# Patient Record
Sex: Male | Born: 1960 | Race: Black or African American | Hispanic: No | Smoking: Never smoker
Health system: Southern US, Community
[De-identification: ages and names within clinical notes are randomized; demographics above are authoritative.]

## PROBLEM LIST (undated history)

## (undated) DIAGNOSIS — I10 Essential (primary) hypertension: Secondary | ICD-10-CM

## (undated) DIAGNOSIS — E7800 Pure hypercholesterolemia, unspecified: Secondary | ICD-10-CM

## (undated) HISTORY — PX: KNEE SURGERY: SHX244

---

## 2019-09-18 ENCOUNTER — Other Ambulatory Visit: Payer: Self-pay

## 2019-09-18 ENCOUNTER — Emergency Department (HOSPITAL_COMMUNITY): Payer: Self-pay

## 2019-09-18 ENCOUNTER — Encounter (HOSPITAL_COMMUNITY): Payer: Self-pay | Admitting: *Deleted

## 2019-09-18 ENCOUNTER — Emergency Department (HOSPITAL_COMMUNITY)
Admission: EM | Admit: 2019-09-18 | Discharge: 2019-09-19 | Disposition: A | Discharge status: Home / Self Care | Payer: Self-pay | Attending: Emergency Medicine | Admitting: Emergency Medicine

## 2019-09-18 DIAGNOSIS — I10 Essential (primary) hypertension: Secondary | ICD-10-CM | POA: Insufficient documentation

## 2019-09-18 DIAGNOSIS — R079 Chest pain, unspecified: Secondary | ICD-10-CM

## 2019-09-18 DIAGNOSIS — R072 Precordial pain: Secondary | ICD-10-CM | POA: Insufficient documentation

## 2019-09-18 LAB — CBC
HCT: 40.1 % (ref 39.0–52.0)
Hemoglobin: 13.4 g/dL (ref 13.0–17.0)
MCH: 31.6 pg (ref 26.0–34.0)
MCHC: 33.4 g/dL (ref 30.0–36.0)
MCV: 94.6 fL (ref 80.0–100.0)
Platelets: 321 10*3/uL (ref 150–400)
RBC: 4.24 MIL/uL (ref 4.22–5.81)
RDW: 11.9 % (ref 11.5–15.5)
WBC: 7.9 10*3/uL (ref 4.0–10.5)
nRBC: 0 % (ref 0.0–0.2)

## 2019-09-18 LAB — BASIC METABOLIC PANEL
Anion gap: 10 (ref 5–15)
BUN: 12 mg/dL (ref 6–20)
CO2: 23 mmol/L (ref 22–32)
Calcium: 9.5 mg/dL (ref 8.9–10.3)
Chloride: 105 mmol/L (ref 98–111)
Creatinine, Ser: 1.05 mg/dL (ref 0.61–1.24)
GFR calc Af Amer: >60 mL/min (ref >60)
GFR calc non Af Amer: >60 mL/min (ref >60)
Glucose, Bld: 101 mg/dL — ABNORMAL HIGH (ref 70–99)
Potassium: 3.1 mmol/L — ABNORMAL LOW (ref 3.5–5.1)
Sodium: 138 mmol/L (ref 135–145)

## 2019-09-18 LAB — TROPONIN I (HIGH SENSITIVITY)
Troponin I (High Sensitivity): 14 ng/L (ref <18)
Troponin I (High Sensitivity): 12 ng/L (ref <18)

## 2019-09-18 MED ORDER — SODIUM CHLORIDE 0.9% FLUSH: 3.0000 mL | Freq: Once | INTRAVENOUS | Status: DC

## 2019-09-18 NOTE — ED Triage Notes (Signed)
Pt reporting right sided chest throbbing just to the right of the sternum, onset while walking, denies any further symptoms.

## 2019-09-18 NOTE — ED Triage Notes (Signed)
Pt arrives via [facility] from the side of the road. He was c/o right sided chest tightness that started about an hour ago after getting off work. Once told he had a "leaky valve". EKG showing RBBB, 324 ASA, refused IV and nitro en route. 70's hr, 150/98, 100% RA, 97.9oral.

## 2019-09-19 ENCOUNTER — Emergency Department (HOSPITAL_COMMUNITY)
Admission: EM | Admit: 2019-09-19 | Discharge: 2019-09-20 | Disposition: A | Discharge status: Home / Self Care | Payer: HRSA Program | Attending: Emergency Medicine | Admitting: Emergency Medicine

## 2019-09-19 ENCOUNTER — Encounter (HOSPITAL_COMMUNITY): Payer: Self-pay | Admitting: Emergency Medicine

## 2019-09-19 ENCOUNTER — Other Ambulatory Visit: Payer: Self-pay

## 2019-09-19 DIAGNOSIS — F1210 Cannabis abuse, uncomplicated: Secondary | ICD-10-CM | POA: Insufficient documentation

## 2019-09-19 DIAGNOSIS — R519 Headache, unspecified: Secondary | ICD-10-CM | POA: Diagnosis not present

## 2019-09-19 DIAGNOSIS — Z20822 Contact with and (suspected) exposure to covid-19: Secondary | ICD-10-CM | POA: Insufficient documentation

## 2019-09-19 DIAGNOSIS — I10 Essential (primary) hypertension: Secondary | ICD-10-CM | POA: Diagnosis not present

## 2019-09-19 DIAGNOSIS — R0789 Other chest pain: Secondary | ICD-10-CM | POA: Diagnosis not present

## 2019-09-19 HISTORY — DX: Pure hypercholesterolemia, unspecified: E78.00

## 2019-09-19 HISTORY — DX: Essential (primary) hypertension: I10

## 2019-09-19 MED ORDER — ACETAMINOPHEN 325 MG PO TABS
650.0000 mg | ORAL_TABLET | Freq: Once | ORAL | Status: AC
  Administered 2019-09-19: 650 mg via ORAL
  Filled 2019-09-19: qty 2

## 2019-09-19 NOTE — ED Provider Notes (Signed)
[facility] EMERGENCY DEPARTMENT Provider Note   CSN: [id number] Arrival date & time: 09/18/19  1938     History Chief Complaint  Patient presents with  . Chest Pain    Jazmine Longshore is a 59 y.o. male.  HPI  HPI: A 59 year old patient with a history of hypertension and hypercholesterolemia presents for evaluation of chest pain. Initial onset of pain was approximately 3-6 hours ago. The patient's chest pain is described as heaviness/pressure/tightness and is not worse with exertion. The patient's chest pain is middle- or left-sided, is not well-localized, is not sharp and does not radiate to the arms/jaw/neck. The patient does not complain of nausea and denies diaphoresis. The patient has smoked in the past 90 days and has a family history of coronary artery disease in a first-degree relative with onset less than age 24. The patient has no history of stroke, has no history of peripheral artery disease, denies any history of treated diabetes and does not have an elevated BMI (>=30).  Patient presents for chest pain.  He reports he was riding his scooter home from work when he started having right-sided chest pain.  It feels like tightness.  It does not radiate anywhere. He has never had this before.  He does report mild shortness of breath. He reports he recently moved here and has no local physician.  He has been told that he has hypertension hyperlipidemia but he does not take medicines.  He was given aspirin in route by EMS, but refused nitroglycerin.  His pain is essentially resolved.  He was told that he had a "leaky valve "previously but cannot elaborate further about this Denies known history of CAD/PE/CVA  PMH- HTN/HLP fam hx - CAD in mother Past Surgical History:  Procedure Laterality Date  . KNEE SURGERY         No family history on file.  Social History   Tobacco Use  . Smoking status: Never Smoker  Substance Use Topics  . Alcohol use: Yes  . Drug use:  Yes    Types: Marijuana    Home Medications Prior to Admission medications   Not on File    Allergies    Patient has no known allergies.  Review of Systems   Review of Systems  Constitutional: Negative for fever.  Respiratory: Positive for shortness of breath.   Cardiovascular: Positive for chest pain.  Gastrointestinal: Negative for vomiting.  All other systems reviewed and are negative.   Physical Exam Updated Vital Signs BP 114/75 (BP Location: Right Arm)   Pulse (!) 51   Temp 97.7 F (36.5 C) (Oral)   Resp 20   Ht 1.791 m (5' 10.5")   Wt 81.6 kg   SpO2 97%   BMI 25.46 kg/m   Physical Exam CONSTITUTIONAL: Well developed/well nourished HEAD: Normocephalic/atraumatic EYES: EOMI/PERRL ENMT: Mucous membranes moist NECK: supple no meningeal signs SPINE/BACK:entire spine nontender CV: S1/S2 noted, no murmurs/rubs/gallops noted LUNGS: Lungs are clear to auscultation bilaterally, no apparent distress Chest - mild right sided tenderness ABDOMEN: soft, nontender, no RUQ tenderness NEURO: Pt is awake/alert/appropriate, moves all extremitiesx4.  No facial droop.   EXTREMITIES: pulses normal/equal, full ROM, no LE edema/tenderness SKIN: warm, color normal PSYCH: no abnormalities of mood noted, alert and oriented to situation  ED Results / Procedures / Treatments   Labs (all labs ordered are listed, but only abnormal results are displayed) Labs Reviewed  BASIC METABOLIC PANEL - Abnormal; Notable for the following components:  Result Value   Potassium 3.1 (*)    Glucose, Bld 101 (*)    All other components within normal limits  CBC  TROPONIN I (HIGH SENSITIVITY)  TROPONIN I (HIGH SENSITIVITY)    EKG EKG Interpretation  Date/Time:  Friday September 18 2019 19:47:15 EST Ventricular Rate:  98 PR Interval:  158 QRS Duration: 130 QT Interval:  408 QTC Calculation: 520 R Axis:   -54 Text Interpretation: Normal sinus rhythm Right bundle branch block Left  anterior fascicular block ** Bifascicular block ** Abnormal ECG No previous ECGs available Confirmed by Kennis Carina [phone]) on 09/18/2019 8:06:30 PM   Radiology DG Chest 2 View  Result Date: 09/18/2019 CLINICAL DATA:  Chest pain EXAM: CHEST - 2 VIEW COMPARISON:  None. FINDINGS: The heart size and mediastinal contours are within normal limits. Both lungs are clear. No acute osseous abnormality. IMPRESSION: No active cardiopulmonary disease. Electronically Signed   By: Jonna Clark M.D.   On: 09/18/2019 21:14    Procedures Procedures  Medications Ordered in ED Medications  sodium chloride flush (NS) 0.9 % injection 3 mL (has no administration in time range)    ED Course  I have reviewed the triage vital signs and the nursing notes.  Pertinent labs & imaging results that were available during my care of the patient were reviewed by me and considered in my medical decision making (see chart for details).    MDM Rules/Calculators/A&P HEAR Score: 5                   Repeat ekg:  EKG Interpretation  Date/Time:  Saturday September 19 2019 05:21:04 EST Ventricular Rate:  47 PR Interval:  158 QRS Duration: 150 QT Interval:  511 QTC Calculation: 452 R Axis:   24 Text Interpretation: Sinus bradycardia Right bundle branch block No significant change since last tracing Confirmed by Zadie Rhine ([id number]) on 09/19/2019 5:29:17 AM       6:24 AM Patient presented with chest pain.  He reports this occurred when he was riding his scooter home for work.  He then called EMS and was given aspirin.  He has been pain-free for several hours, but did also have some reproducible chest pain. He reports a history of untreated hypertension/hyperlipidemia, but here his blood pressures have been normal Patient is not hypoxic, I have low suspicion for PE.  Low suspicion for dissection.  He has been resting comfortably in the ER watching television in no distress He does have an abnormal EKG with a right  bundle branch block, but is unclear if these are acute findings as we have no old to compare  Plan will be to discharge home. He will be referred to [facility] to establish with PCP We discussed strict return precautions Final Clinical Impression(s) / ED Diagnoses Final diagnoses:  Precordial pain  Nonspecific chest pain    Rx / DC Orders ED Discharge Orders    None       Zadie Rhine, MD 09/19/19 [phone]

## 2019-09-19 NOTE — Discharge Instructions (Addendum)
Thank you for allowing me to care for you today in the Emergency Department.   Your COVID test is pending.  You will receive a call from someone at the hospital with the results within 2 days only if your test is positive.  If you test is positive, you need to stay out of work for 14 days from when you had a positive COVID-19 test or when your symptoms began, whichever is earlier.   In the meantime, you should treat yourself as if you have the COVID-19 virus.  You should quarantine yourself at home.  If you have others in your home, try to isolate yourself in a separate room. Make sure that you are washing your hands frequently with warm water and soap. Additional information is included along with your discharge paperwork.  Take 650 mg of Tylenol or 600 mg of ibuprofen with food every 6 hours for pain.  You can alternate between these 2 medications every 3 hours if your pain returns.  For instance, you can take Tylenol at noon, followed by a dose of ibuprofen at 3, followed by second dose of Tylenol and 6.  If your COVID test is negative and you are continue to have symptoms, please follow-up with primary care.  If you do not have a primary care provider, please call the number on your discharge paperwork to get established with a primary care provider.  Return to the emergency department if you develop respiratory distress, if your finger lips turn blue, if you pass out, develop severe chest pain, if your fever stays elevated despite taking Tylenol as described above, or other new, concerning symptoms.

## 2019-09-19 NOTE — Discharge Instructions (Signed)

## 2019-09-19 NOTE — ED Notes (Signed)
Called [facility] for transport to [facility]

## 2019-09-19 NOTE — ED Triage Notes (Signed)
Patient requesting Covid test , denies any symptoms , afebrile/respirations unlabored.

## 2019-09-19 NOTE — ED Provider Notes (Signed)
[facility] EMERGENCY DEPARTMENT Provider Note   CSN: [id number] Arrival date & time: 09/19/19  2144     History Chief Complaint  Patient presents with  . Requesting Covid test    Derrick Santana is a 59 y.o. male with a history of hypertension and hypercholesteremia who presents to the emergency department with a chief complaint of requesting a COVID-19 test.  The patient was seen in the ER yesterday for chest pain that developed while he was riding his scooter home from work.  He reports that chest pain has since resolved.  He reports that he is feeling "hot" and has a headache, but has no other complaints at this time including chills, body aches, loss of sense of taste or smell, chest pain, shortness of breath, cough, nausea, vomiting, diarrhea, abdominal pain, dizziness, lightheadedness.  He states that after he was evaluated in the ER last night that "it dawned on him" that he might have COVID-19 because his brother currently lives with him, and his brother tested positive for COVID-19 4 days ago.  The history is provided by the patient. No language interpreter was used.       Past Medical History:  Diagnosis Date  . Hypercholesteremia   . Hypertension     There are no problems to display for this patient.   Past Surgical History:  Procedure Laterality Date  . KNEE SURGERY         No family history on file.  Social History   Tobacco Use  . Smoking status: Never Smoker  Substance Use Topics  . Alcohol use: Yes  . Drug use: Yes    Types: Marijuana    Home Medications Prior to Admission medications   Not on File    Allergies    Patient has no known allergies.  Review of Systems   Review of Systems  Constitutional: Negative for appetite change, chills and fever.  HENT: Negative for congestion and sore throat.   Eyes: Negative for visual disturbance.  Respiratory: Negative for shortness of breath and wheezing.   Cardiovascular: Negative  for chest pain and palpitations.  Gastrointestinal: Negative for abdominal pain, diarrhea, nausea and vomiting.  Genitourinary: Negative for dysuria, frequency, penile pain, penile swelling and urgency.  Musculoskeletal: Negative for arthralgias, back pain, myalgias, neck pain and neck stiffness.  Skin: Negative for rash.  Allergic/Immunologic: Negative for immunocompromised state.  Neurological: Positive for headaches. Negative for dizziness, seizures, syncope, weakness and numbness.  Psychiatric/Behavioral: Negative for confusion.    Physical Exam Updated Vital Signs BP (!) 128/93 (BP Location: Right Arm)   Pulse 70   Temp (!) 97.5 F (36.4 C) (Oral)   Resp 18   SpO2 93%   Physical Exam Vitals and nursing note reviewed.  Constitutional:      General: He is not in acute distress.    Appearance: He is well-developed. He is not ill-appearing, toxic-appearing or diaphoretic.     Comments: Well-appearing.  No acute distress.  HENT:     Head: Normocephalic.  Eyes:     Conjunctiva/sclera: Conjunctivae normal.  Cardiovascular:     Rate and Rhythm: Normal rate and regular rhythm.     Pulses: Normal pulses.     Heart sounds: Normal heart sounds. No murmur. No friction rub. No gallop.   Pulmonary:     Effort: Pulmonary effort is normal. No respiratory distress.     Breath sounds: No stridor. No wheezing, rhonchi or rales.     Comments: Lungs are  clear to auscultation bilaterally.  No increased work of breathing.  Speaks in complete, fluent sentences without increased work of breathing. Chest:     Chest wall: No tenderness.  Abdominal:     General: There is no distension.     Palpations: Abdomen is soft. There is no mass.     Tenderness: There is no abdominal tenderness. There is no right CVA tenderness, left CVA tenderness, guarding or rebound.     Hernia: No hernia is present.  Musculoskeletal:     Cervical back: Neck supple.  Skin:    General: Skin is warm and dry.    Neurological:     Mental Status: He is alert.  Psychiatric:        Behavior: Behavior normal.     ED Results / Procedures / Treatments   Labs (all labs ordered are listed, but only abnormal results are displayed) Labs Reviewed  NOVEL CORONAVIRUS, NAA (HOSP ORDER, SEND-OUT TO REF LAB; TAT 18-24 HRS)    EKG None  Radiology DG Chest 2 View  Result Date: 09/18/2019 CLINICAL DATA:  Chest pain EXAM: CHEST - 2 VIEW COMPARISON:  None. FINDINGS: The heart size and mediastinal contours are within normal limits. Both lungs are clear. No acute osseous abnormality. IMPRESSION: No active cardiopulmonary disease. Electronically Signed   By: Prudencio Pair M.D.   On: 09/18/2019 21:14    Procedures Procedures (including critical care time)  Medications Ordered in ED Medications  acetaminophen (TYLENOL) tablet 650 mg (650 mg Oral Given 09/19/19 2333)    ED Course  I have reviewed the triage vital signs and the nursing notes.  Pertinent labs & imaging results that were available during my care of the patient were reviewed by me and considered in my medical decision making (see chart for details).    MDM Rules/Calculators/A&P                      59 year old male with a history of hypertension and hypercholesteremia who was seen and evaluated in the ER yesterday for chest pain and underwent extensive ER work-up that was reassuring.  He was discharged home with recommended follow-up with primary care in 1 to 2 weeks.  He returns today because he is concerned that the pain that he was feeling yesterday may be secondary to his brother, who lives with him, testing positive for COVID-19 4 days ago.  He is requesting a COVID-19 test.  His only complaint at this time is a headache.  Tylenol given.  COVID-19 test has been ordered.  His vital signs are normal.  Will discharge home with COVID-19 precautions.  The patient has been made aware that it may take up to 2 days for his test to return and that he  needs to remain out of work until his test has resulted.  He has also been given home quarantine guidelines.  No other complaints at this time.  He is hemodynamically stable no acute distress.  ER return precautions given.  Safe for discharge home with outpatient follow-up as indicated.  Final Clinical Impression(s) / ED Diagnoses Final diagnoses:  Contact with and (suspected) exposure to covid-19    Rx / DC Orders ED Discharge Orders    None       Joanne Gavel, PA-C 15/17/61 [id number]    Delora Fuel, MD 71/06/26 [phone]

## 2019-09-20 ENCOUNTER — Encounter (HOSPITAL_COMMUNITY): Payer: Self-pay

## 2019-09-20 ENCOUNTER — Emergency Department (HOSPITAL_COMMUNITY)
Admission: EM | Admit: 2019-09-20 | Discharge: 2019-09-20 | Disposition: A | Discharge status: Home / Self Care | Payer: Self-pay | Attending: Emergency Medicine | Admitting: Emergency Medicine

## 2019-09-20 DIAGNOSIS — Z5321 Procedure and treatment not carried out due to patient leaving prior to being seen by health care provider: Secondary | ICD-10-CM | POA: Insufficient documentation

## 2019-09-20 DIAGNOSIS — M7918 Myalgia, other site: Secondary | ICD-10-CM | POA: Insufficient documentation

## 2019-09-20 NOTE — ED Notes (Signed)
No answer from patient when called for room x 1.  

## 2019-09-20 NOTE — ED Notes (Signed)
Pt seen exiting the department. Reports that he is leaving.
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
## 2019-09-21 LAB — NOVEL CORONAVIRUS, NAA (HOSP ORDER, SEND-OUT TO REF LAB; TAT 18-24 HRS): SARS-CoV-2, NAA: NOT DETECTED

## 2020-02-08 ENCOUNTER — Other Ambulatory Visit: Payer: Self-pay

## 2020-02-08 ENCOUNTER — Emergency Department (HOSPITAL_COMMUNITY): Payer: Self-pay

## 2020-02-08 ENCOUNTER — Encounter (HOSPITAL_COMMUNITY): Payer: Self-pay | Admitting: Emergency Medicine

## 2020-02-08 ENCOUNTER — Emergency Department (HOSPITAL_COMMUNITY)
Admission: EM | Admit: 2020-02-08 | Discharge: 2020-02-08 | Payer: Self-pay | Attending: Emergency Medicine | Admitting: Emergency Medicine

## 2020-02-08 DIAGNOSIS — H1032 Unspecified acute conjunctivitis, left eye: Secondary | ICD-10-CM | POA: Insufficient documentation

## 2020-02-08 DIAGNOSIS — Y939 Activity, unspecified: Secondary | ICD-10-CM | POA: Insufficient documentation

## 2020-02-08 DIAGNOSIS — S0990XA Unspecified injury of head, initial encounter: Secondary | ICD-10-CM

## 2020-02-08 DIAGNOSIS — Y929 Unspecified place or not applicable: Secondary | ICD-10-CM | POA: Insufficient documentation

## 2020-02-08 DIAGNOSIS — Y999 Unspecified external cause status: Secondary | ICD-10-CM | POA: Insufficient documentation

## 2020-02-08 DIAGNOSIS — S0083XA Contusion of other part of head, initial encounter: Secondary | ICD-10-CM | POA: Insufficient documentation

## 2020-02-08 DIAGNOSIS — I10 Essential (primary) hypertension: Secondary | ICD-10-CM | POA: Insufficient documentation

## 2020-02-08 MED ORDER — POLYMYXIN B-TRIMETHOPRIM 10000-0.1 UNIT/ML-% OP SOLN
1.0000 [drp] | OPHTHALMIC | Status: DC
  Administered 2020-02-08: 1 [drp] via OPHTHALMIC
  Filled 2020-02-08: qty 10
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
## 2020-02-08 NOTE — Discharge Instructions (Signed)
Return to the emergency room if you start having severe headache or vomiting.  Take the antibiotic eyedrops as prescribed.  Follow-up with an eye doctor next week if the symptoms have not resolved
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
## 2020-02-08 NOTE — ED Notes (Signed)
Pt unable to sign because police escort has him in handcuffs. Pt verbalized understanding of discharge paperwork.

## 2020-02-09 ENCOUNTER — Ambulatory Visit (HOSPITAL_COMMUNITY): Admission: RE | Admit: 2020-02-09 | Payer: Self-pay | Source: Ambulatory Visit

## 2020-02-11 ENCOUNTER — Emergency Department (HOSPITAL_COMMUNITY)
Admission: EM | Admit: 2020-02-11 | Discharge: 2020-02-11 | Disposition: A | Discharge status: Home / Self Care | Payer: Self-pay | Attending: Emergency Medicine | Admitting: Emergency Medicine

## 2020-02-11 ENCOUNTER — Other Ambulatory Visit: Payer: Self-pay

## 2020-02-11 ENCOUNTER — Encounter (HOSPITAL_COMMUNITY): Payer: Self-pay | Admitting: Obstetrics and Gynecology

## 2020-02-11 DIAGNOSIS — I10 Essential (primary) hypertension: Secondary | ICD-10-CM | POA: Insufficient documentation

## 2020-02-11 DIAGNOSIS — H1133 Conjunctival hemorrhage, bilateral: Secondary | ICD-10-CM | POA: Insufficient documentation

## 2020-02-11 MED ORDER — FLUORESCEIN SODIUM 1 MG OP STRP
1.0000 | ORAL_STRIP | Freq: Once | OPHTHALMIC | Status: DC
  Filled 2020-02-11: qty 1

## 2020-02-11 MED ORDER — TETRACAINE HCL 0.5 % OP SOLN
2.0000 [drp] | Freq: Once | OPHTHALMIC | Status: AC
  Administered 2020-02-11: 2 [drp] via OPHTHALMIC
  Filled 2020-02-11: qty 4

## 2020-02-11 MED ORDER — POLYMYXIN B-TRIMETHOPRIM 10000-0.1 UNIT/ML-% OP SOLN
1.0000 [drp] | OPHTHALMIC | Status: DC
  Administered 2020-02-11: 1 [drp] via OPHTHALMIC
  Filled 2020-02-11: qty 10

## 2020-02-11 NOTE — ED Triage Notes (Signed)
Pt reports to the ED for eye pain and redness. Patient reports his eyes have been red. [place]

## 2020-02-11 NOTE — Discharge Instructions (Signed)
Apply eyedrop 1-2 drops to each eyes every 4-6 hrs for the next 5 days. Follow up with eye specialist if you notice no improvement.

## 2020-02-11 NOTE — ED Provider Notes (Signed)
[facility]-EMERGENCY DEPT Provider Note   CSN: [id number] Arrival date & time: 02/11/20  2021     History Chief Complaint  Patient presents with  . Eye Drainage    Winton Offord is a 59 y.o. male.  The history is provided by the patient and medical records. No language interpreter was used.     59 year old male presenting complaining of eye discomfort. Pt report eyes irritation x 1 week.  I started with one eye and then spread to the other.  Report soreness and watery.  Was seen 2 days ago and was prescribed polytrim drops but he lost it today and is here requesting a refill of the medication.  He denies vision changes, headache, pressure behind eyes.  Denies cold sxs.  Patient denies any recent injury.  Denies rash itchiness but does admits to rubbing his eyes.    Past Medical History:  Diagnosis Date  . Hypercholesteremia   . Hypertension     There are no problems to display for this patient.   Past Surgical History:  Procedure Laterality Date  . KNEE SURGERY         No family history on file.  Social History   Tobacco Use  . Smoking status: Never Smoker  . Smokeless tobacco: Never Used  Substance Use Topics  . Alcohol use: Yes  . Drug use: Yes    Types: Marijuana    Home Medications Prior to Admission medications   Not on File    Allergies    Patient has no known allergies.  Review of Systems   Review of Systems  Constitutional: Negative for fever.  HENT: Negative for congestion.   Eyes: Positive for pain and redness. Negative for photophobia, discharge, itching and visual disturbance.    Physical Exam Updated Vital Signs BP (!) 150/91 (BP Location: Right Arm)   Pulse 86   Temp 99.4 F (37.4 C) (Oral)   Resp 18   SpO2 100%   Physical Exam Vitals and nursing note reviewed.  Constitutional:      General: He is not in acute distress.    Appearance: He is well-developed.  HENT:     Head: Atraumatic.  Eyes:      Extraocular Movements: Extraocular movements intact.     Conjunctiva/sclera:     Right eye: Right conjunctiva is injected. Chemosis and hemorrhage present.     Left eye: Chemosis present.     Pupils: Pupils are equal, round, and reactive to light.     Comments: Left lower eyelid is mildly edematous. Pupils equal round reactive, extraocular movements intact. No foreign body noted. Conjunctiva is injected bilaterally with evidence of chemosis as well as subconjunctival hemorrhage No hyphema  Musculoskeletal:     Cervical back: Neck supple.  Skin:    Findings: No rash.  Neurological:     Mental Status: He is alert.     ED Results / Procedures / Treatments   Labs (all labs ordered are listed, but only abnormal results are displayed) Labs Reviewed - No data to display  EKG None  Radiology No results found.  Procedures Procedures (including critical care time)  Medications Ordered in ED Medications  fluorescein ophthalmic strip 1 strip (has no administration in time range)  tetracaine (PONTOCAINE) 0.5 % ophthalmic solution 2 drop (2 drops Right Eye Given by Other 02/11/20 2132)    ED Course  I have reviewed the triage vital signs and the nursing notes.  Pertinent labs & imaging results that  were available during my care of the patient were reviewed by me and considered in my medical decision making (see chart for details).    MDM Rules/Calculators/A&P                      BP (!) 150/91 (BP Location: Right Arm)   Pulse 86   Temp 99.4 F (37.4 C) (Oral)   Resp 18   SpO2 100%   Final Clinical Impression(s) / ED Diagnoses Final diagnoses:  Subconjunctival hemorrhage, bilateral    Rx / DC Orders ED Discharge Orders    None     9:58 PM Patient here requesting for refill of his Polytrim that was previously prescribed 2 days ago when he was seen in the ED for head injury but was reporting having redness to both eyes prior to the head injury.  He lost his Polytrim and  requesting for replacement.  He denies any significant pain or vision changes.  He is adamant about getting a replacement of the eyedrops.  Will order Polytrim.  Patient request to be discharged so he can catch the bus. Exam is limited due to pt not being cooperative.  Eye specialist referral given.     Domenic Moras, PA-C 02/11/20 2202    Isla Pence, MD 02/11/20 [phone]

## 2020-03-16 ENCOUNTER — Emergency Department (HOSPITAL_COMMUNITY)
Admission: EM | Admit: 2020-03-16 | Discharge: 2020-03-17 | Disposition: A | Discharge status: Home / Self Care | Payer: Self-pay | Attending: Emergency Medicine | Admitting: Emergency Medicine

## 2020-03-16 ENCOUNTER — Emergency Department (HOSPITAL_COMMUNITY): Payer: Self-pay

## 2020-03-16 DIAGNOSIS — Z7689 Persons encountering health services in other specified circumstances: Secondary | ICD-10-CM

## 2020-03-16 DIAGNOSIS — R0789 Other chest pain: Secondary | ICD-10-CM | POA: Insufficient documentation

## 2020-03-16 DIAGNOSIS — F10929 Alcohol use, unspecified with intoxication, unspecified: Secondary | ICD-10-CM | POA: Insufficient documentation

## 2020-03-16 DIAGNOSIS — Z202 Contact with and (suspected) exposure to infections with a predominantly sexual mode of transmission: Secondary | ICD-10-CM | POA: Insufficient documentation

## 2020-03-16 DIAGNOSIS — Z79899 Other long term (current) drug therapy: Secondary | ICD-10-CM | POA: Insufficient documentation

## 2020-03-16 DIAGNOSIS — R519 Headache, unspecified: Secondary | ICD-10-CM | POA: Insufficient documentation

## 2020-03-16 DIAGNOSIS — Y901 Blood alcohol level of 20-39 mg/100 ml: Secondary | ICD-10-CM | POA: Insufficient documentation

## 2020-03-16 DIAGNOSIS — F1990 Other psychoactive substance use, unspecified, uncomplicated: Secondary | ICD-10-CM | POA: Insufficient documentation

## 2020-03-16 DIAGNOSIS — F10920 Alcohol use, unspecified with intoxication, uncomplicated: Secondary | ICD-10-CM

## 2020-03-16 LAB — CBC WITH DIFFERENTIAL/PLATELET
Abs Immature Granulocytes: 0.02 10*3/uL (ref 0.00–0.07)
Basophils Absolute: 0.1 10*3/uL (ref 0.0–0.1)
Basophils Relative: 2 %
Eosinophils Absolute: 0 10*3/uL (ref 0.0–0.5)
Eosinophils Relative: 1 %
HCT: 45.5 % (ref 39.0–52.0)
Hemoglobin: 14.9 g/dL (ref 13.0–17.0)
Immature Granulocytes: 0 %
Lymphocytes Relative: 36 %
Lymphs Abs: 2 10*3/uL (ref 0.7–4.0)
MCH: 31.6 pg (ref 26.0–34.0)
MCHC: 32.7 g/dL (ref 30.0–36.0)
MCV: 96.6 fL (ref 80.0–100.0)
Monocytes Absolute: 0.4 10*3/uL (ref 0.1–1.0)
Monocytes Relative: 7 %
Neutro Abs: 3.1 10*3/uL (ref 1.7–7.7)
Neutrophils Relative %: 54 %
Platelets: 322 10*3/uL (ref 150–400)
RBC: 4.71 MIL/uL (ref 4.22–5.81)
RDW: 11.6 % (ref 11.5–15.5)
WBC: 5.7 10*3/uL (ref 4.0–10.5)
nRBC: 0 % (ref 0.0–0.2)

## 2020-03-16 MED ORDER — LORAZEPAM 2 MG/ML IJ SOLN
1.0000 mg | Freq: Once | INTRAMUSCULAR | Status: AC
  Administered 2020-03-16: 1 mg via INTRAMUSCULAR
  Filled 2020-03-16 (×2): qty 1

## 2020-03-16 NOTE — ED Triage Notes (Signed)
PT BIB [facility] after [facility]. EMS reports that pt was c/o pain that radiated from R side of neck to the chest. PT self reports hx drug use, HTN,  and leaky heart valve. [employer] at bedside. HR 75, O2 95% RA, B/P 141/102

## 2020-03-17 ENCOUNTER — Emergency Department (HOSPITAL_COMMUNITY): Payer: Self-pay

## 2020-03-17 ENCOUNTER — Other Ambulatory Visit: Payer: Self-pay

## 2020-03-17 LAB — RPR: RPR Ser Ql: NONREACTIVE

## 2020-03-17 LAB — HIV ANTIBODY (ROUTINE TESTING W REFLEX): HIV Screen 4th Generation wRfx: NONREACTIVE

## 2020-03-17 LAB — RAPID URINE DRUG SCREEN, HOSP PERFORMED
Amphetamines: NOT DETECTED
Barbiturates: NOT DETECTED
Benzodiazepines: NOT DETECTED
Cocaine: POSITIVE — AB
Opiates: NOT DETECTED
Tetrahydrocannabinol: NOT DETECTED

## 2020-03-17 LAB — BASIC METABOLIC PANEL
Anion gap: 10 (ref 5–15)
BUN: <5 mg/dL — ABNORMAL LOW (ref 6–20)
CO2: 26 mmol/L (ref 22–32)
Calcium: 9.5 mg/dL (ref 8.9–10.3)
Chloride: 108 mmol/L (ref 98–111)
Creatinine, Ser: 0.95 mg/dL (ref 0.61–1.24)
GFR calc Af Amer: >60 mL/min (ref >60)
GFR calc non Af Amer: >60 mL/min (ref >60)
Glucose, Bld: 75 mg/dL (ref 70–99)
Potassium: 3.5 mmol/L (ref 3.5–5.1)
Sodium: 144 mmol/L (ref 135–145)

## 2020-03-17 LAB — TROPONIN I (HIGH SENSITIVITY)
Troponin I (High Sensitivity): 22 ng/L — ABNORMAL HIGH (ref <18)
Troponin I (High Sensitivity): 19 ng/L — ABNORMAL HIGH (ref <18)
Troponin I (High Sensitivity): 17 ng/L (ref <18)

## 2020-03-17 LAB — ETHANOL: Alcohol, Ethyl (B): 34 mg/dL — ABNORMAL HIGH (ref <10)

## 2020-03-17 NOTE — ED Notes (Signed)
Food and Fluids given

## 2020-03-17 NOTE — ED Provider Notes (Signed)
[facility] EMERGENCY DEPARTMENT Provider Note   CSN: [id number] Arrival date & time: 03/16/20  2148     History Chief Complaint  Patient presents with  . Chest Pain    Clarence Cogswell is a 59 y.o. male.  HPI    Patient is a 59 year old male with a history of hyperlipidemia, hypertension, who presents to the emergency department today for evaluation via EMS with [employer].  Patient coming from jail.  He is intoxicated and therefore there is a level 5 caveat as he is not a reliable historian. States he is here for "everything ".  He is complaining of chest pain that been present intermittently for the last 2 weeks.  Denies any shortness of breath.  He is also complaining of pain to his head and talking about how he has a leaky heart valve.  He does admit to using alcohol and cocaine earlier today. Reports every day use of crack.  Also requesting HIV test stating in had intercourse with a woman and the condom broke. He is asymptomatic in regards to this.    Past Medical History:  Diagnosis Date  . Hypercholesteremia   . Hypertension     There are no problems to display for this patient.   Past Surgical History:  Procedure Laterality Date  . KNEE SURGERY         No family history on file.  Social History   Tobacco Use  . Smoking status: Never Smoker  . Smokeless tobacco: Never Used  Substance Use Topics  . Alcohol use: Yes  . Drug use: Yes    Types: Marijuana    Home Medications Prior to Admission medications   Not on File    Allergies    Patient has no known allergies.  Review of Systems   Review of Systems  Review of Systems  Reason unable to perform ROS: intoxicated.  Cardiovascular: Positive for chest pain.  Gastrointestinal: Negative for abdominal pain.  Neurological:       Head pain   Physical Exam Updated Vital Signs BP 100/77   Pulse (!) 55   Temp 98.3 F (36.8 C) (Oral)   Resp 16   Ht 5\' 10"  (1.778 m)   Wt 81.6 kg   SpO2 96%    BMI 25.83 kg/m   Physical Exam  BP (!) 141/102   Pulse 82   Temp 98.3 F (36.8 C) (Oral)   Resp 19   SpO2 98%   Physical Exam Vitals and nursing note reviewed.  Constitutional:      Appearance: He is well-developed.  HENT:     Head: Normocephalic.     Comments: Hematoma to occiput that is mildly TTP Eyes:     Conjunctiva/sclera: Conjunctivae normal.  Cardiovascular:     Rate and Rhythm: Normal rate and regular rhythm.     Heart sounds: Normal heart sounds. No murmur heard.   Pulmonary:     Effort: Pulmonary effort is normal. No respiratory distress.     Breath sounds: Normal breath sounds. No decreased breath sounds, wheezing, rhonchi or rales.  Chest:     Chest wall: Tenderness (left anterior chest wall) present.  Abdominal:     General: Bowel sounds are normal.     Palpations: Abdomen is soft.     Tenderness: There is no abdominal tenderness. There is no guarding or rebound.  Musculoskeletal:     Cervical back: Neck supple.  Skin:    General: Skin is warm and dry.  Neurological:     Mental Status: He is alert.     Comments: Intoxicated, but alert with clear speech and no facial droop.   Psychiatric:        Attention and Perception: He is inattentive.        Mood and Affect: Affect is labile.        Speech: Speech is rapid and pressured.        Behavior: Behavior is uncooperative, agitated and aggressive.   ED Results / Procedures / Treatments   Labs (all labs ordered are listed, but only abnormal results are displayed) Labs Reviewed  BASIC METABOLIC PANEL - Abnormal; Notable for the following components:      Result Value   BUN <5 (*)    All other components within normal limits  ETHANOL - Abnormal; Notable for the following components:   Alcohol, Ethyl (B) 34 (*)    All other components within normal limits  RAPID URINE DRUG SCREEN, HOSP PERFORMED - Abnormal; Notable for the following components:   Cocaine POSITIVE (*)    All other components within  normal limits  TROPONIN I (HIGH SENSITIVITY) - Abnormal; Notable for the following components:   Troponin I (High Sensitivity) 22 (*)    All other components within normal limits  TROPONIN I (HIGH SENSITIVITY) - Abnormal; Notable for the following components:   Troponin I (High Sensitivity) 19 (*)    All other components within normal limits  CBC WITH DIFFERENTIAL/PLATELET  HIV ANTIBODY (ROUTINE TESTING W REFLEX)  RPR  GC/CHLAMYDIA PROBE AMP ([place]) NOT AT [facility]  TROPONIN I (HIGH SENSITIVITY)    EKG EKG Interpretation  Date/Time:  Thursday March 17 2020 02:33:38 EDT Ventricular Rate:  63 PR Interval:    QRS Duration: 145 QT Interval:  437 QTC Calculation: 448 R Axis:   -12 Text Interpretation: Sinus rhythm Right bundle branch block No significant change since last tracing Confirmed by Ross Marcus ([id number]) on 03/17/2020 2:40:16 AM    Radiology CT Head Wo Contrast  Result Date: 03/17/2020 CLINICAL DATA:  Headaches and neck pain, no known injury, initial encounter EXAM: CT HEAD WITHOUT CONTRAST CT CERVICAL SPINE WITHOUT CONTRAST TECHNIQUE: Multidetector CT imaging of the head and cervical spine was performed following the standard protocol without intravenous contrast. Multiplanar CT image reconstructions of the cervical spine were also generated. COMPARISON:  None. FINDINGS: CT HEAD FINDINGS Brain: No evidence of acute infarction, hemorrhage, hydrocephalus, extra-axial collection or mass lesion/mass effect. Cavum septum pellucidum is noted. Vascular: No hyperdense vessel or unexpected calcification. Skull: Normal. Negative for fracture or focal lesion. Sinuses/Orbits: No acute finding. Other: None. CT CERVICAL SPINE FINDINGS Alignment: Within normal limits. Skull base and vertebrae: 7 cervical segments are well visualized. Vertebral body height is well maintained. Mild osteophytic changes are noted at C5-6 and C6-7. Mild facet hypertrophic changes are noted. No acute fracture or  acute facet abnormality is noted. Soft tissues and spinal canal: Surrounding soft tissue structures are within normal limits. Upper chest: Within normal limits. Other: None IMPRESSION: CT of the head: No acute intracranial abnormality noted. CT of the cervical spine: Mild degenerative change without acute abnormality. Electronically Signed   By: Alcide Clever M.D.   On: 03/17/2020 00:46   CT Cervical Spine Wo Contrast  Result Date: 03/17/2020 CLINICAL DATA:  Headaches and neck pain, no known injury, initial encounter EXAM: CT HEAD WITHOUT CONTRAST CT CERVICAL SPINE WITHOUT CONTRAST TECHNIQUE: Multidetector CT imaging of the head and cervical spine was performed following the  standard protocol without intravenous contrast. Multiplanar CT image reconstructions of the cervical spine were also generated. COMPARISON:  None. FINDINGS: CT HEAD FINDINGS Brain: No evidence of acute infarction, hemorrhage, hydrocephalus, extra-axial collection or mass lesion/mass effect. Cavum septum pellucidum is noted. Vascular: No hyperdense vessel or unexpected calcification. Skull: Normal. Negative for fracture or focal lesion. Sinuses/Orbits: No acute finding. Other: None. CT CERVICAL SPINE FINDINGS Alignment: Within normal limits. Skull base and vertebrae: 7 cervical segments are well visualized. Vertebral body height is well maintained. Mild osteophytic changes are noted at C5-6 and C6-7. Mild facet hypertrophic changes are noted. No acute fracture or acute facet abnormality is noted. Soft tissues and spinal canal: Surrounding soft tissue structures are within normal limits. Upper chest: Within normal limits. Other: None IMPRESSION: CT of the head: No acute intracranial abnormality noted. CT of the cervical spine: Mild degenerative change without acute abnormality. Electronically Signed   By: Alcide Clever M.D.   On: 03/17/2020 00:46   DG Chest Portable 1 View  Result Date: 03/16/2020 CLINICAL DATA:  Chest pain. EXAM: PORTABLE  CHEST 1 VIEW COMPARISON:  09/18/2019 FINDINGS: The cardiomediastinal contours are normal. The lungs are clear. Pulmonary vasculature is normal. No consolidation, pleural effusion, or pneumothorax. No acute osseous abnormalities are seen. IMPRESSION: No acute chest findings. Electronically Signed   By: Narda Rutherford M.D.   On: 03/16/2020 22:31    Procedures Procedures (including critical care time)  Medications Ordered in ED Medications  LORazepam (ATIVAN) injection 1 mg (1 mg Intramuscular Given 03/16/20 2253)    ED Course  I have reviewed the triage vital signs and the nursing notes.  Pertinent labs & imaging results that were available during my care of the patient were reviewed by me and considered in my medical decision making (see chart for details).    MDM Rules/Calculators/A&P                          59 y/o M presenting from jail c/o chest pain and head pain. Also requesting STD testing. Obviously intoxicated and is not cooperative during exam.  Reviewed/interpreted labs CBC nonacute BMP nonacute EtOH marginally elevated Troponin marginally elevated, delta troponin is downtending, repeat trop continues to trend downward HIV, RPR, GC/chlamydia pending  EKGs with Sinus rhythm Right bundle branch block No significant change since last tracing   CXR reviewed/interpreted - no PTX, PNA, or other emergent abnormality.   CT head/cervical spine - neg for acute traumatic injury  Discussed case with Dr. Blinda Leatherwood, attending physician who reviewed w/u and recommends obtaining 3rd trop.   Repeat Theodis Aguas continues to be downtrending.  Patient remains pain-free and has had no evidence of EKG changes here in the ED, low likelihood for ACS at this time.  Doubt PE, dissection or other emergent etiology at this time to cause patient symptoms.  Feel he is appropriate for discharge with outpatient follow-up.  He voiced understanding plan and reasons to return.  All questions answered patient  stable for discharge   Final Clinical Impression(s) / ED Diagnoses Final diagnoses:  Atypical chest pain  Substance use  Alcoholic intoxication without complication (HCC)  Encounter for assessment of sexually transmitted disease exposure    Rx / DC Orders ED Discharge Orders    None       Karrie Meres, PA-C 03/17/20 0556    Gilda Crease, MD 03/17/20 0630

## 2020-03-17 NOTE — Discharge Instructions (Signed)

## 2020-03-18 LAB — GC/CHLAMYDIA PROBE AMP (~~LOC~~) NOT AT ARMC
Chlamydia: NEGATIVE
Comment: NEGATIVE
Comment: NORMAL
Neisseria Gonorrhea: NEGATIVE

## 2021-12-27 ENCOUNTER — Encounter: Payer: Self-pay | Admitting: *Deleted

## 2021-12-27 NOTE — Congregational Nurse Program (Signed)
?  Dept: [phone] ? ? ?Congregational Nurse Program Note ? ?Date of Encounter: 12/27/2021 ? ?Past Medical History: ?Past Medical History:  ?Diagnosis Date  ? Hypercholesteremia   ? Hypertension   ? ? ?Encounter Details: ? CNP Questionnaire - 12/27/21 1154   ? ?  ? Questionnaire  ? Do you give verbal consent to treat you today? Yes   ? Location Patient Served  [facility]   ? Visit Setting Church or Organization   ? Patient Status Unknown   ? Insurance Uninsured ([employer]/Self-Pay)   ? Insurance Referral N/A   ? Medication N/A   ? Medical Provider Yes   ? Screening Referrals N/A   ? Medical Referral Drug/Alcohol Services   ? Medical Appointment Made Other   ? Food N/A   ? Transportation Provided transportation assistance   ? Housing/Utilities N/A   ? Interpersonal Safety N/A   ? Intervention Blood pressure;Support;Navigate Healthcare System   ? ED Visit Averted N/A   ? Life-Saving Intervention Made N/A   ? ?  ?  ? ?  ? 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 Called [facility] and they are not taking clients at this time. Blood pressure 115/85, pulse (!) 115. Sent client by taxi to [place] for detox.  ?Travanti Mcmanus W RN CN ? ? ?

## 2022-01-11 IMAGING — CT CT CERVICAL SPINE W/O CM
3 of 4 series · 11 of 33 positions shown, 13 images · non-contrast
Comparison: None.

CLINICAL DATA: Headaches and neck pain, no known injury, initial
encounter

EXAM:
CT HEAD WITHOUT CONTRAST
CT CERVICAL SPINE WITHOUT CONTRAST
TECHNIQUE: Multidetector CT imaging of the head and cervical spine was
performed following the standard protocol without intravenous
contrast. Multiplanar CT image reconstructions of the cervical spine
were also generated.

[Series 8: sag bone · sagittal · 0.25mm/px · 5 of 78 slices shown, 6 images]
[im 26/78  bone]
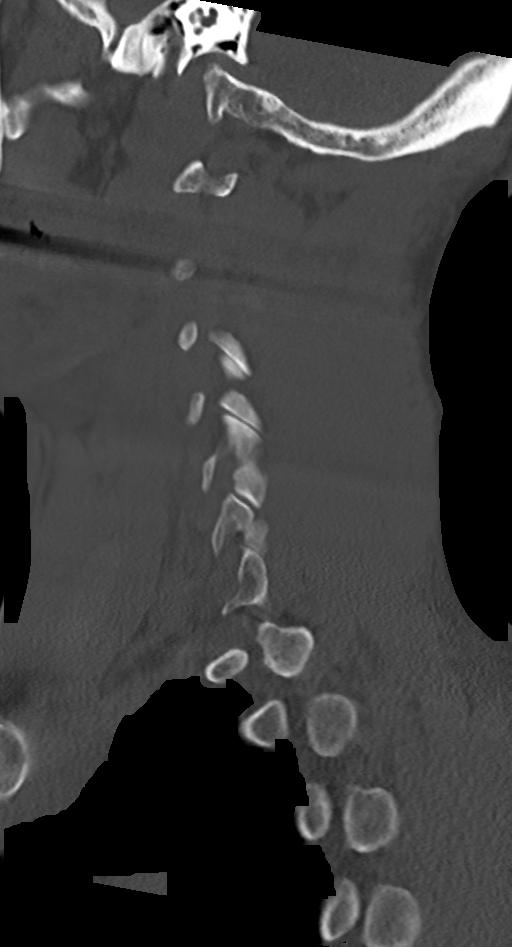
[im 33/78  bone]
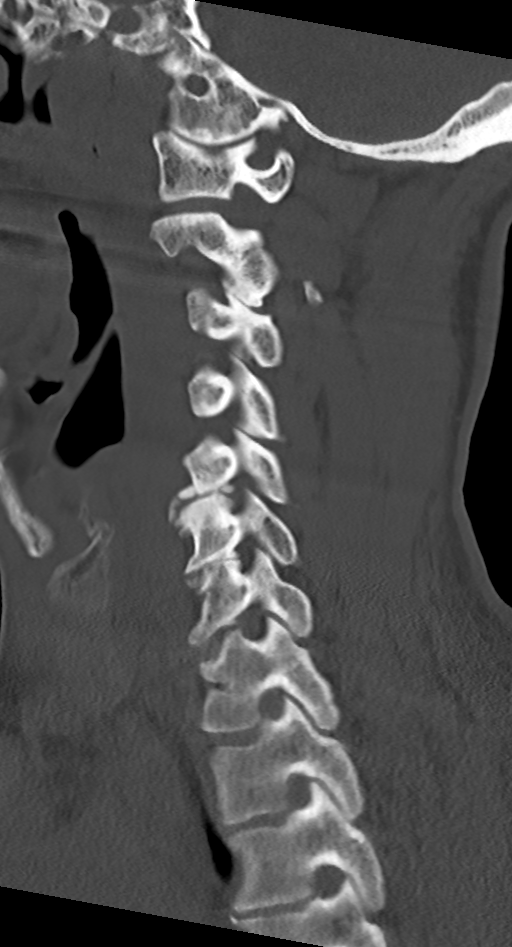
[im 39/78  soft-tissue]
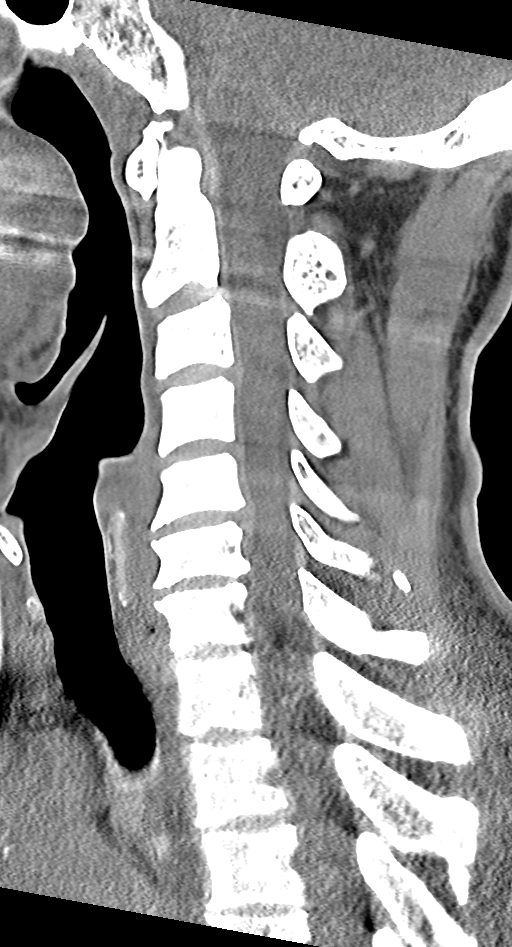
[im 39/78  bone]
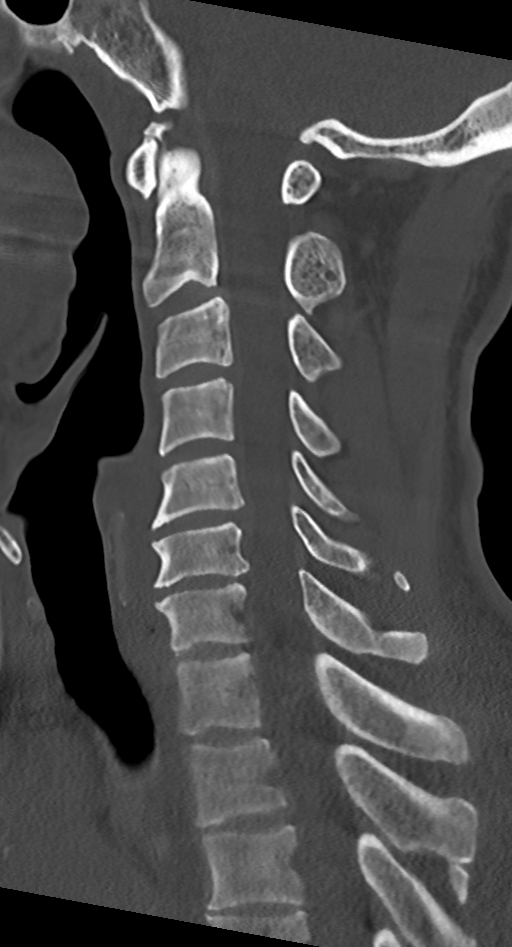
[im 45/78  bone]
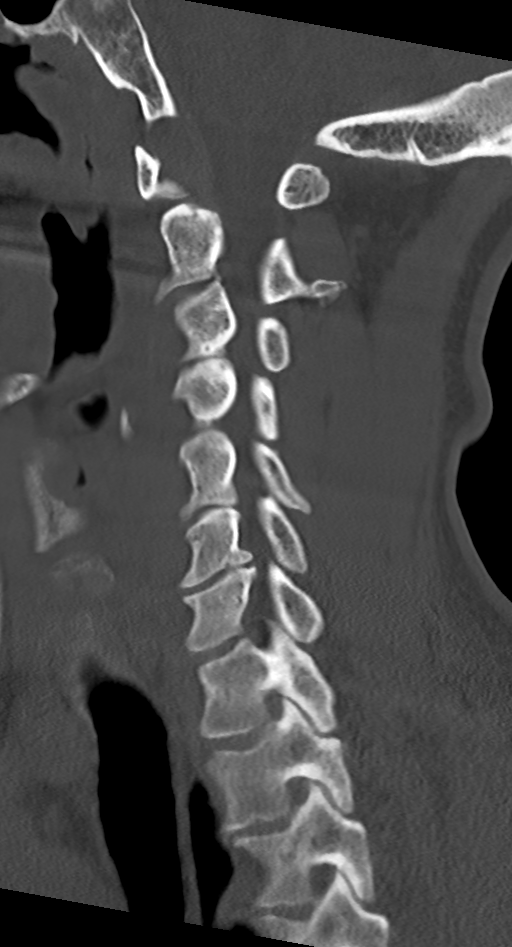
[im 52/78  bone]
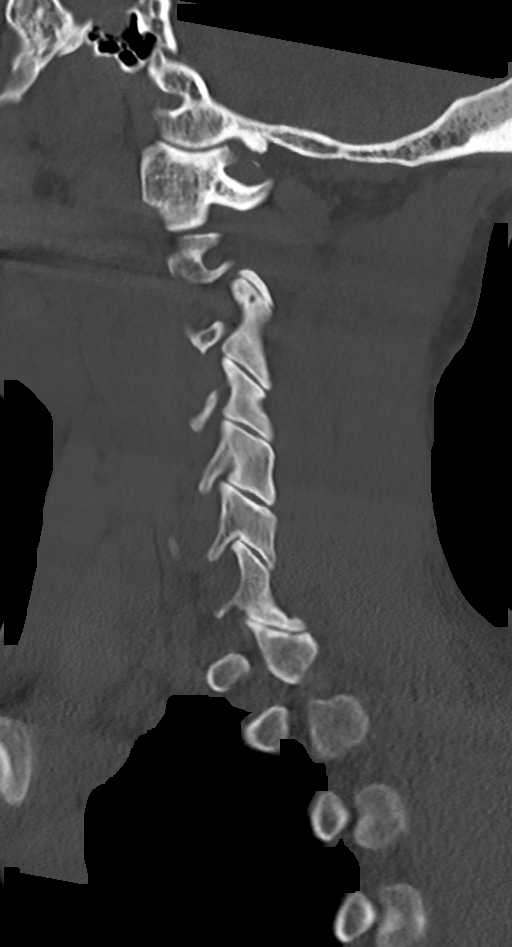

[Series 9: cor bone · coronal · 0.29mm/px · 3 of 65 slices shown]
[im 13/65  bone]
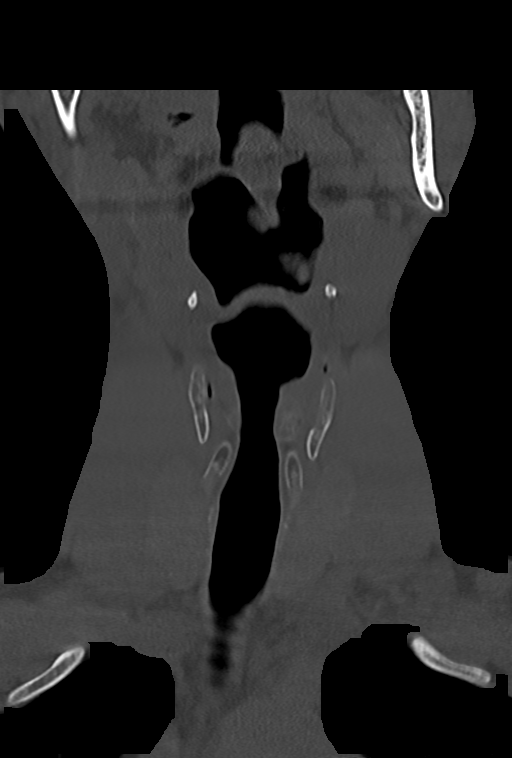
[im 26/65  bone]
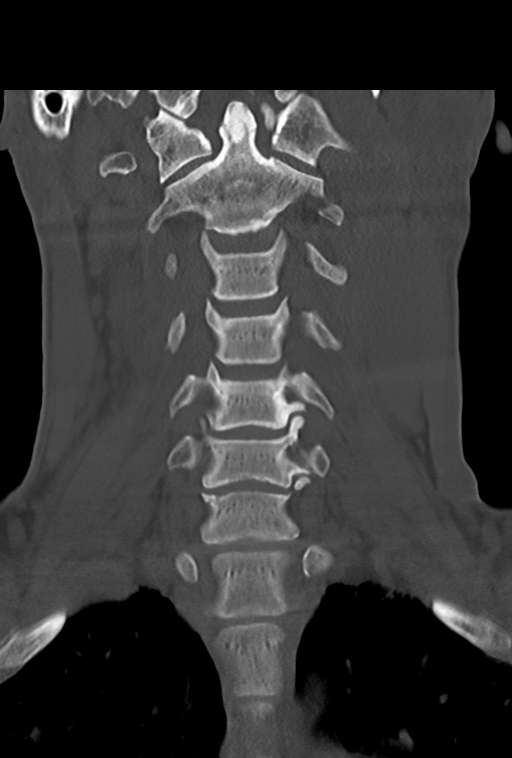
[im 39/65  bone]
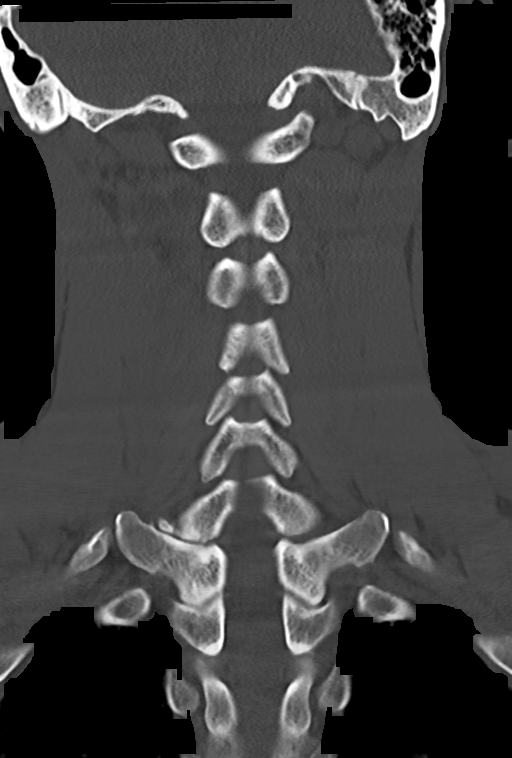

[Series 10: orthogonal axials · axial · 0.21mm/px · z∈[-330,-201]mm · 3 of 106 slices shown, 4 images]
[im 18/106  soft-tissue]
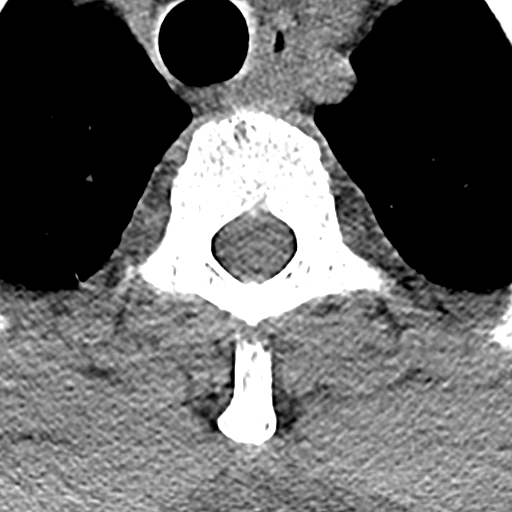
[im 18/106  bone]
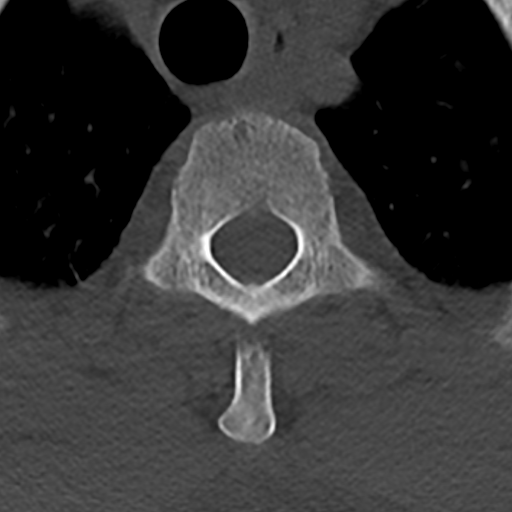
[im 53/106  bone]
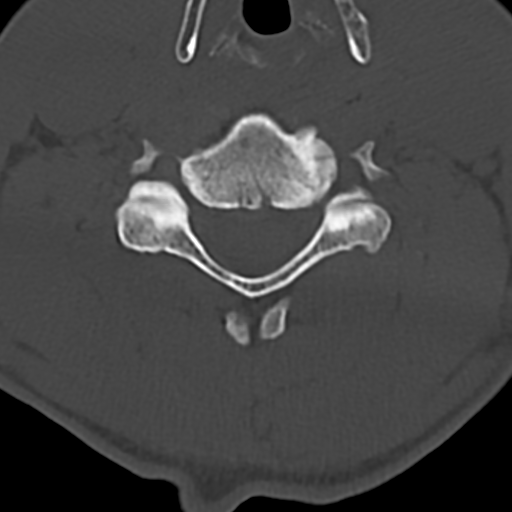
[im 88/106  bone]
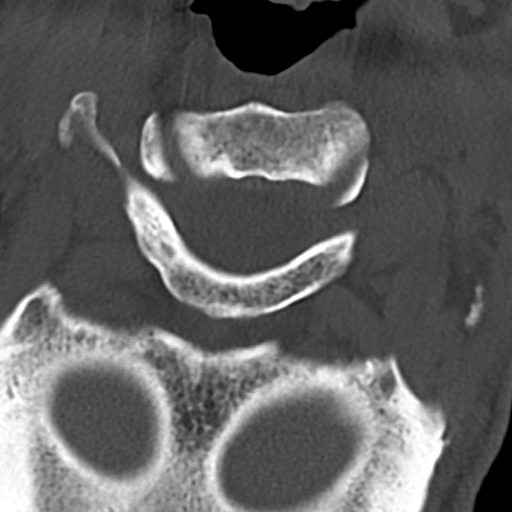

[11 of 33 positions shown; findings below may reference images not displayed]

FINDINGS: CT HEAD FINDINGS

Brain: No evidence of acute infarction, hemorrhage, hydrocephalus,
extra-axial collection or mass lesion/mass effect. Cavum septum
pellucidum is noted.

Vascular: No hyperdense vessel or unexpected calcification.

Skull: Normal. Negative for fracture or focal lesion.

Sinuses/Orbits: No acute finding.

Other: None.

CT CERVICAL SPINE FINDINGS

Alignment: Within normal limits.

Skull base and vertebrae: 7 cervical segments are well visualized.
Vertebral body height is well maintained. Mild osteophytic changes
are noted at C5-6 and C6-7. Mild facet hypertrophic changes are
noted. No acute fracture or acute facet abnormality is noted.

Soft tissues and spinal canal: Surrounding soft tissue structures
are within normal limits.

Upper chest: Within normal limits.

Other: None
IMPRESSION: CT of the head: No acute intracranial abnormality noted.

CT of the cervical spine: Mild degenerative change without acute
abnormality.
